# Patient Record
Sex: Male | Born: 1970
Health system: Southern US, Community
[De-identification: ages and names within clinical notes are randomized; demographics above are authoritative.]

## PROBLEM LIST (undated history)

## (undated) DIAGNOSIS — I420 Dilated cardiomyopathy: Secondary | ICD-10-CM

## (undated) HISTORY — PX: APPENDECTOMY: SHX54

## (undated) HISTORY — PX: OTHER SURGICAL HISTORY: SHX169

## (undated) HISTORY — DX: Dilated cardiomyopathy: I42.0

## (undated) HISTORY — PX: VASECTOMY: SHX75

---

## 2015-07-23 ENCOUNTER — Encounter: Payer: Self-pay | Admitting: Family

## 2015-07-23 ENCOUNTER — Ambulatory Visit (INDEPENDENT_AMBULATORY_CARE_PROVIDER_SITE_OTHER): Payer: BC Managed Care – PPO | Admitting: Family

## 2015-07-23 VITALS — BP 120/84 | HR 70 | Temp 98.5°F | Resp 18 | Ht 72.0 in | Wt 223.0 lb

## 2015-07-23 DIAGNOSIS — Z23 Encounter for immunization: Secondary | ICD-10-CM

## 2015-07-23 DIAGNOSIS — Z0000 Encounter for general adult medical examination without abnormal findings: Secondary | ICD-10-CM | POA: Diagnosis not present

## 2015-07-23 NOTE — Patient Instructions (Addendum)
Thank you for choosing [facility].  Summary/Instructions:  Please return on Wednesday or Thursday to have the TB skin test evaluated.   Health Maintenance, Male A healthy lifestyle and preventative care can promote health and wellness.  Maintain regular health, dental, and eye exams.  Eat a healthy diet. Foods like vegetables, fruits, whole grains, low-fat dairy products, and lean protein foods contain the nutrients you need and are low in calories. Decrease your intake of foods high in solid fats, added sugars, and salt. Get information about a proper diet from your health care provider, if necessary.  Regular physical exercise is one of the most important things you can do for your health. Most adults should get at least 150 minutes of moderate-intensity exercise (any activity that increases your heart rate and causes you to sweat) each week. In addition, most adults need muscle-strengthening exercises on 2 or more days a week.   Maintain a healthy weight. The body mass index (BMI) is a screening tool to identify possible weight problems. It provides an estimate of body fat based on height and weight. Your health care provider can find your BMI and can help you achieve or maintain a healthy weight. For males 20 years and older:  A BMI below 18.5 is considered underweight.  A BMI of 18.5 to 24.9 is normal.  A BMI of 25 to 29.9 is considered overweight.  A BMI of 30 and above is considered obese.  Maintain normal blood lipids and cholesterol by exercising and minimizing your intake of saturated fat. Eat a balanced diet with plenty of fruits and vegetables. Blood tests for lipids and cholesterol should begin at age 45 and be repeated every 5 years. If your lipid or cholesterol levels are high, you are over age 45, or you are at high risk for heart disease, you may need your cholesterol levels checked more frequently.Ongoing high lipid and cholesterol levels should be treated with  medicines if diet and exercise are not working.  If you smoke, find out from your health care provider how to quit. If you do not use tobacco, do not start.  Lung cancer screening is recommended for adults aged 55-80 years who are at high risk for developing lung cancer because of a history of smoking. A yearly low-dose CT scan of the lungs is recommended for people who have at least a 30-pack-year history of smoking and are current smokers or have quit within the past 15 years. A pack year of smoking is smoking an average of 1 pack of cigarettes a day for 1 year (for example, a 30-pack-year history of smoking could mean smoking 1 pack a day for 30 years or 2 packs a day for 15 years). Yearly screening should continue until the smoker has stopped smoking for at least 15 years. Yearly screening should be stopped for people who develop a health problem that would prevent them from having lung cancer treatment.  If you choose to drink alcohol, do not have more than 2 drinks per day. One drink is considered to be 12 oz (360 mL) of beer, 5 oz (150 mL) of wine, or 1.5 oz (45 mL) of liquor.  Avoid the use of street drugs. Do not share needles with anyone. Ask for help if you need support or instructions about stopping the use of drugs.  High blood pressure causes heart disease and increases the risk of stroke. High blood pressure is more likely to develop in:  People who have blood pressure  in the end of the normal range (100-139/85-89 mm Hg).  People who are overweight or obese.  People who are African American.  If you are 1-37 years of age, have your blood pressure checked every 3-5 years. If you are 2 years of age or older, have your blood pressure checked every year. You should have your blood pressure measured twice--once when you are at a hospital or clinic, and once when you are not at a hospital or clinic. Record the average of the two measurements. To check your blood pressure when you are not  at a hospital or clinic, you can use:  An automated blood pressure machine at a pharmacy.  A home blood pressure monitor.  If you are 79-55 years old, ask your health care provider if you should take aspirin to prevent heart disease.  Diabetes screening involves taking a blood sample to check your fasting blood sugar level. This should be done once every 3 years after age 21 if you are at a normal weight and without risk factors for diabetes. Testing should be considered at a younger age or be carried out more frequently if you are overweight and have at least 1 risk factor for diabetes.  Colorectal cancer can be detected and often prevented. Most routine colorectal cancer screening begins at the age of 49 and continues through age 47. However, your health care provider may recommend screening at an earlier age if you have risk factors for colon cancer. On a yearly basis, your health care provider may provide home test kits to check for hidden blood in the stool. A small camera at the end of a tube may be used to directly examine the colon (sigmoidoscopy or colonoscopy) to detect the earliest forms of colorectal cancer. Talk to your health care provider about this at age 36 when routine screening begins. A direct exam of the colon should be repeated every 5-10 years through age 80, unless early forms of precancerous polyps or small growths are found.  People who are at an increased risk for hepatitis B should be screened for this virus. You are considered at high risk for hepatitis B if:  You were born in a country where hepatitis B occurs often. Talk with your health care provider about which countries are considered high risk.  Your parents were born in a high-risk country and you have not received a shot to protect against hepatitis B (hepatitis B vaccine).  You have HIV or AIDS.  You use needles to inject street drugs.  You live with, or have sex with, someone who has hepatitis B.  You  are a man who has sex with other men (MSM).  You get hemodialysis treatment.  You take certain medicines for conditions like cancer, organ transplantation, and autoimmune conditions.  Hepatitis C blood testing is recommended for all people born from [birth year] through 1966 and any individual with known risk factors for hepatitis C.  Healthy men should no longer receive prostate-specific antigen (PSA) blood tests as part of routine cancer screening. Talk to your health care provider about prostate cancer screening.  Testicular cancer screening is not recommended for adolescents or adult males who have no symptoms. Screening includes self-exam, a health care provider exam, and other screening tests. Consult with your health care provider about any symptoms you have or any concerns you have about testicular cancer.  Practice safe sex. Use condoms and avoid high-risk sexual practices to reduce the spread of sexually transmitted infections (STIs).  You should be screened for STIs, including gonorrhea and chlamydia if:  You are sexually active and are younger than 24 years.  You are older than 24 years, and your health care provider tells you that you are at risk for this type of infection.  Your sexual activity has changed since you were last screened, and you are at an increased risk for chlamydia or gonorrhea. Ask your health care provider if you are at risk.  If you are at risk of being infected with HIV, it is recommended that you take a prescription medicine daily to prevent HIV infection. This is called pre-exposure prophylaxis (PrEP). You are considered at risk if:  You are a man who has sex with other men (MSM).  You are a heterosexual man who is sexually active with multiple partners.  You take drugs by injection.  You are sexually active with a partner who has HIV.  Talk with your health care provider about whether you are at high risk of being infected with HIV. If you choose to begin  PrEP, you should first be tested for HIV. You should then be tested every 3 months for as long as you are taking PrEP.  Use sunscreen. Apply sunscreen liberally and repeatedly throughout the day. You should seek shade when your shadow is shorter than you. Protect yourself by wearing long sleeves, pants, a wide-brimmed hat, and sunglasses year round whenever you are outdoors.  Tell your health care provider of new moles or changes in moles, especially if there is a change in shape or color. Also, tell your health care provider if a mole is larger than the size of a pencil eraser.  A one-time screening for abdominal aortic aneurysm (AAA) and surgical repair of large AAAs by ultrasound is recommended for men aged 65-75 years who are current or former smokers.  Stay current with your vaccines (immunizations).   This information is not intended to replace advice given to you by your health care provider. Make sure you discuss any questions you have with your health care provider.   Document Released: 02/28/2008 Document Revised: 09/22/2014 Document Reviewed: 01/27/2011 Elsevier Interactive Patient Education [employer]

## 2015-07-23 NOTE — Assessment & Plan Note (Addendum)
1) Anticipatory Guidance: Discussed importance of wearing a seatbelt while driving and not texting while driving; changing batteries in smoke detector at least once annually; wearing suntan lotion when outside; eating a balanced and moderate diet; getting physical activity at least 30 minutes per day.  2) Immunizations / Screenings / Labs: All immunizations are up to date per recommendations. Due for a TB skin test for pre-employment. All other screenings are up to date per recommendations. Obtain CBC, BMET, Lipid profile and TSH.   Overall well exam with minimal risk factors for cardiovascular disease with the exception of the cardiomyopathy. Continue healthy lifestyle behaviors. Declines blood work as previously performed through another provider with results requested. [facility] [employer] [facility] a [employer]. TB skin test placed. Cleared pending TB results. Follow up prevention exam in 1 year, follow up office visit as needed.

## 2015-07-23 NOTE — Progress Notes (Signed)
Pre visit review using our clinic review tool, if applicable. No additional management support is needed unless otherwise documented below in the visit note.  Refused flu shot 

## 2015-07-23 NOTE — Progress Notes (Signed)
Subjective:    Patient ID: Mark Graves, male    DOB: 08/15/1971, 44 y.o.   MRN: [id number]  Chief Complaint  Patient presents with  . Establish Care    having joint pains that come and goes and does know if thats from not working out where as he used to work out all time    HPI:  Mark Graves is a 44 y.o. male who presents today for an annual wellness visit.   1) Health Maintenance -   Diet - Averages about 3 meals per day consisting of whole grains, pork, chicken, beef, and vegetables. Caffeine intake of about 2 cups per day  Exercise - 4-5x per week of resistance and cardio   2) Preventative Exams / Immunizations:  Dental -- Due for exam   Vision -- Up to date   Health Maintenance  Topic Date Due  . HIV Screening  01/22/1985  . INFLUENZA VACCINE  05/30/2016 (Originally 04/16/2015)  . TETANUS/TDAP  07/20/2024     There is no immunization history on file for this patient.  No Known Allergies   No outpatient prescriptions prior to visit.   No facility-administered medications prior to visit.     Past Medical History  Diagnosis Date  . Dilated cardiomyopathy [facility])      Past Surgical History  Procedure Laterality Date  . Appendectomy    . Vasectomy    . Dilated cardiomyapathy       Family History  Problem Relation Age of Onset  . Diabetes Mother   . Heart disease Maternal Grandmother   . Cancer Maternal Grandmother   . Heart disease Maternal Grandfather   . Heart disease Paternal Grandmother   . Heart disease Paternal Grandfather      Social History   Social History  . Marital Status: Married    Spouse Name: N/A  . Number of Children: 4  . Years of Education: 14   Occupational History  . Not on file.   Social History Main Topics  . Smoking status: Never Smoker   . Smokeless tobacco: Never Used  . Alcohol Use: No  . Drug Use: No  . Sexual Activity: Not on file   Other Topics Concern  . Not on file   Social History  Narrative   Fun: Workout, acting, sports   Denies religious beliefs effecting health care.     Review of Systems  Constitutional: Denies fever, chills, fatigue, or significant weight gain/loss. HENT: Head: Denies headache or neck pain Ears: Denies changes in hearing, ringing in ears, earache, drainage Nose: Denies discharge, stuffiness, itching, nosebleed, sinus pain Throat: Denies sore throat, hoarseness, dry mouth, sores, thrush Eyes: Denies loss/changes in vision, pain, redness, blurry/double vision, flashing lights Cardiovascular: Denies chest pain/discomfort, tightness, palpitations, shortness of breath with activity, difficulty lying down, swelling, sudden awakening with shortness of breath Respiratory: Denies shortness of breath, cough, sputum production, wheezing Gastrointestinal: Denies dysphasia, heartburn, change in appetite, nausea, change in bowel habits, rectal bleeding, constipation, diarrhea, yellow skin or eyes Genitourinary: Denies frequency, urgency, burning/pain, blood in urine, incontinence, change in urinary strength. Musculoskeletal: Denies muscle/joint pain, stiffness, back pain, redness or swelling of joints, trauma Skin: Denies rashes, lumps, itching, dryness, color changes, or hair/nail changes Neurological: Denies dizziness, fainting, seizures, weakness, numbness, tingling, tremor Psychiatric - Denies nervousness, stress, depression or memory loss Endocrine: Denies heat or cold intolerance, sweating, frequent urination, excessive thirst, changes in appetite Hematologic: Denies ease of bruising or bleeding     Objective:  BP 120/84 mmHg  Pulse 70  Temp(Src) 98.5 F (36.9 C) (Oral)  Resp 18  Ht 6' (1.829 m)  Wt 223 lb (101.152 kg)  BMI 30.24 kg/m2  SpO2 97% Nursing note and vital signs reviewed.  Physical Exam  Constitutional: He is oriented to person, place, and time. He appears well-developed and well-nourished.  HENT:  Head: Normocephalic.    Right Ear: Hearing, tympanic membrane, external ear and ear canal normal.  Left Ear: Hearing, tympanic membrane, external ear and ear canal normal.  Nose: Nose normal.  Mouth/Throat: Uvula is midline, oropharynx is clear and moist and mucous membranes are normal.  Eyes: Conjunctivae and EOM are normal. Pupils are equal, round, and reactive to light.  Neck: Neck supple. No JVD present. No tracheal deviation present. No thyromegaly present.  Cardiovascular: Normal rate, regular rhythm, normal heart sounds and intact distal pulses.   Pulmonary/Chest: Effort normal and breath sounds normal.  Abdominal: Soft. Bowel sounds are normal. He exhibits no distension and no mass. There is no tenderness. There is no rebound and no guarding.  Musculoskeletal: Normal range of motion. He exhibits no edema or tenderness.  Lymphadenopathy:    He has no cervical adenopathy.  Neurological: He is alert and oriented to person, place, and time. He has normal reflexes. No cranial nerve deficit. He exhibits normal muscle tone. Coordination normal.  Skin: Skin is warm and dry.  Psychiatric: He has a normal mood and affect. His behavior is normal. Judgment and thought content normal.       Assessment & Plan:   Problem List Items Addressed This Visit      Other   Routine general medical examination at a health care facility - Primary    1) Anticipatory Guidance: Discussed importance of wearing a seatbelt while driving and not texting while driving; changing batteries in smoke detector at least once annually; wearing suntan lotion when outside; eating a balanced and moderate diet; getting physical activity at least 30 minutes per day.  2) Immunizations / Screenings / Labs: All immunizations are up to date per recommendations. Due for a TB skin test for pre-employment. All other screenings are up to date per recommendations. Obtain CBC, BMET, Lipid profile and TSH.   Overall well exam with minimal risk factors for  cardiovascular disease with the exception of the cardiomyopathy. Continue healthy lifestyle behaviors. Declines blood work as previously performed through another provider with results requested. [facility] [employer] [facility] a [employer]. TB skin test placed. Cleared pending TB results. Follow up prevention exam in 1 year, follow up office visit as needed.

## 2015-07-26 DIAGNOSIS — Z23 Encounter for immunization: Secondary | ICD-10-CM | POA: Diagnosis not present

## 2015-07-26 LAB — TB SKIN TEST
Induration: 0 mm
TB Skin Test: NEGATIVE

## 2015-07-26 NOTE — Addendum Note (Signed)
Addended by: Mercer PodWRENN, Jessenya Berdan E on: 07/26/2015 09:34 AM   Modules accepted: Orders

## 2017-05-12 ENCOUNTER — Ambulatory Visit (INDEPENDENT_AMBULATORY_CARE_PROVIDER_SITE_OTHER): Payer: BC Managed Care – PPO | Admitting: Physician Assistant

## 2017-05-12 ENCOUNTER — Ambulatory Visit (INDEPENDENT_AMBULATORY_CARE_PROVIDER_SITE_OTHER): Payer: BC Managed Care – PPO

## 2017-05-12 ENCOUNTER — Encounter: Payer: Self-pay | Admitting: Physician Assistant

## 2017-05-12 VITALS — BP 127/83 | HR 78 | Temp 98.3°F | Resp 16 | Ht 72.0 in | Wt 219.2 lb

## 2017-05-12 DIAGNOSIS — M545 Low back pain, unspecified: Secondary | ICD-10-CM

## 2017-05-12 LAB — POCT URINALYSIS DIP (MANUAL ENTRY)
Bilirubin, UA: NEGATIVE
Blood, UA: NEGATIVE
Glucose, UA: NEGATIVE mg/dL
Ketones, POC UA: NEGATIVE mg/dL
Leukocytes, UA: NEGATIVE
Nitrite, UA: NEGATIVE
Protein Ur, POC: NEGATIVE mg/dL
Spec Grav, UA: 1.025 (ref 1.010–1.025)
Urobilinogen, UA: 0.2 E.U./dL
pH, UA: 6.5 (ref 5.0–8.0)

## 2017-05-12 LAB — POC MICROSCOPIC URINALYSIS (UMFC): Mucus: ABSENT

## 2017-05-12 MED ORDER — CYCLOBENZAPRINE HCL 10 MG PO TABS: 10.0000 mg | ORAL_TABLET | Freq: Three times a day (TID) | ORAL | 0 refills | Status: DC | PRN

## 2017-05-12 MED ORDER — MELOXICAM 15 MG PO TABS: 15.0000 mg | ORAL_TABLET | Freq: Every day | ORAL | 1 refills | Status: DC

## 2017-05-12 NOTE — Patient Instructions (Addendum)
Your x-ray shows is no evidence of lumbar spine fracture. Alignment is normal. Intervertebral disc spaces are maintained.  Apply heat and/or ice to your low back 2-3 times daily. Put ice in a plastic bag. Place a towel between your skin and the bag or between your plaster splint and the bag. Leave the ice on for 20 minutes, 2-3 times a day. Ibuprofen and/or Tylenol for pain.  Stretching and moving will help greatly. See below stretches and do this 2-3 times daily.  Stay well hydrated - drink 2-3 liters of water daily.  Core strength helps your posture and maintaining good lumbar support. Start doing core-strengthening exercises.  Acupuncture may be a reasonable option for interested patients with access to an acupuncturist. Massage and chiropractic measures may help as well.   Flexeril is a muscle relaxer. Take this as prescribed. This will be helpful to you at night to relax your muscles and help you sleep. Meloxicam is an antiinflammatory. Take this once daily. Do not use with any other otc pain medication other than tylenol/acetaminophen - so no aleve, ibuprofen, motrin, advil, etc. You may take these medications at the same time.  Come back if you are not improving in 4-6 weeks.   Thank you for coming in today. I hope you feel we met your needs.  Feel free to call PCP if you have any questions or further requests.  Please consider signing up for MyChart if you do not already have it, as this is a great way to communicate with me.  Best,  Whitney McVey, PA-C   Low Back Strain Rehab Ask your health care provider which exercises are safe for you. Do exercises exactly as told by your health care provider and adjust them as directed. It is normal to feel mild stretching, pulling, tightness, or discomfort as you do these exercises, but you should stop right away if you feel sudden pain or your pain gets worse. Do not begin these exercises until told by your health care provider. Stretching and  range of motion exercises These exercises warm up your muscles and joints and improve the movement and flexibility of your back. These exercises also help to relieve pain, numbness, and tingling. Exercise A: Single knee to chest  1. Lie on your back on a firm surface with both legs straight. 2. Bend one of your knees. Use your hands to move your knee up toward your chest until you feel a gentle stretch in your lower back and buttock. ? Hold your leg in this position by holding onto the front of your knee. ? Keep your other leg as straight as possible. 3. Hold for __________ seconds. 4. Slowly return to the starting position. 5. Repeat with your other leg. Repeat __________ times. Complete this exercise __________ times a day. Exercise B: Prone extension on elbows  1. Lie on your abdomen on a firm surface. 2. Prop yourself up on your elbows. 3. Use your arms to help lift your chest up until you feel a gentle stretch in your abdomen and your lower back. ? This will place some of your body weight on your elbows. If this is uncomfortable, try stacking pillows under your chest. ? Your hips should stay down, against the surface that you are lying on. Keep your hip and back muscles relaxed. 4. Hold for __________ seconds. 5. Slowly relax your upper body and return to the starting position. Repeat __________ times. Complete this exercise __________ times a day. Strengthening exercises These exercises  build strength and endurance in your back. Endurance is the ability to use your muscles for a long time, even after they get tired. Exercise C: Pelvic tilt 1. Lie on your back on a firm surface. Bend your knees and keep your feet flat. 2. Tense your abdominal muscles. Tip your pelvis up toward the ceiling and flatten your lower back into the floor. ? To help with this exercise, you may place a small towel under your lower back and try to push your back into the towel. 3. Hold for __________  seconds. 4. Let your muscles relax completely before you repeat this exercise. Repeat __________ times. Complete this exercise __________ times a day. Exercise D: Alternating arm and leg raises  1. Get on your hands and knees on a firm surface. If you are on a hard floor, you may want to use padding to cushion your knees, such as an exercise mat. 2. Line up your arms and legs. Your hands should be below your shoulders, and your knees should be below your hips. 3. Lift your left leg behind you. At the same time, raise your right arm and straighten it in front of you. ? Do not lift your leg higher than your hip. ? Do not lift your arm higher than your shoulder. ? Keep your abdominal and back muscles tight. ? Keep your hips facing the ground. ? Do not arch your back. ? Keep your balance carefully, and do not hold your breath. 4. Hold for __________ seconds. 5. Slowly return to the starting position and repeat with your right leg and your left arm. Repeat __________ times. Complete this exercise __________times a day. Exercise J: Single leg lower with bent knees 1. Lie on your back on a firm surface. 2. Tense your abdominal muscles and lift your feet off the floor, one foot at a time, so your knees and hips are bent in an "L" shape (at about 90 degrees). ? Your knees should be over your hips and your lower legs should be parallel to the floor. 3. Keeping your abdominal muscles tense and your knee bent, slowly lower one of your legs so your toe touches the ground. 4. Lift your leg back up to return to the starting position. ? Do not hold your breath. ? Do not let your back arch. Keep your back flat against the ground. 5. Repeat with your other leg. Repeat __________ times. Complete this exercise __________ times a day. Posture and body mechanics  Body mechanics refers to the movements and positions of your body while you do your daily activities. Posture is part of body mechanics. Good  posture and healthy body mechanics can help to relieve stress in your body's tissues and joints. Good posture means that your spine is in its natural S-curve position (your spine is neutral), your shoulders are pulled back slightly, and your head is not tipped forward. The following are general guidelines for applying improved posture and body mechanics to your everyday activities. Standing   When standing, keep your spine neutral and your feet about hip-width apart. Keep a slight bend in your knees. Your ears, shoulders, and hips should line up.  When you do a task in which you stand in one place for a long time, place one foot up on a stable object that is 2-4 inches (5-10 cm) high, such as a footstool. This helps keep your spine neutral. Sitting   When sitting, keep your spine neutral and keep your feet flat on  the floor. Use a footrest, if necessary, and keep your thighs parallel to the floor. Avoid rounding your shoulders, and avoid tilting your head forward.  When working at a desk or a computer, keep your desk at a height where your hands are slightly lower than your elbows. Slide your chair under your desk so you are close enough to maintain good posture.  When working at a computer, place your monitor at a height where you are looking straight ahead and you do not have to tilt your head forward or downward to look at the screen. Resting   When lying down and resting, avoid positions that are most painful for you.  If you have pain with activities such as sitting, bending, stooping, or squatting (flexion-based activities), lie in a position in which your body does not bend very much. For example, avoid curling up on your side with your arms and knees near your chest (fetal position).  If you have pain with activities such as standing for a long time or reaching with your arms (extension-based activities), lie with your spine in a neutral position and bend your knees slightly. Try the  following positions: ? Lying on your side with a pillow between your knees. ? Lying on your back with a pillow under your knees. Lifting   When lifting objects, keep your feet at least shoulder-width apart and tighten your abdominal muscles.  Bend your knees and hips and keep your spine neutral. It is important to lift using the strength of your legs, not your back. Do not lock your knees straight out.  Always ask for help to lift heavy or awkward objects. This information is not intended to replace advice given to you by your health care provider. Make sure you discuss any questions you have with your health care provider. Document Released: 09/01/2005 Document Revised: 05/08/2016 Document Reviewed: 06/13/2015 Elsevier Interactive Patient Education  2018 [employer].   IF you received an x-ray today, you will receive an invoice from [facility]. Please contact [facility] at [phone] with questions or concerns regarding your invoice.   IF you received labwork today, you will receive an invoice from [facility]. Please contact [facility] at [phone] with questions or concerns regarding your invoice.   Our billing staff will not be able to assist you with questions regarding bills from these companies.  You will be contacted with the lab results as soon as they are available. The fastest way to get your results is to activate your My Chart account. Instructions are located on the last page of this paperwork. If you have not heard from [place] regarding the results in 2 weeks, please contact this office.

## 2017-05-12 NOTE — Progress Notes (Signed)
   Mark Graves  MRN: [id number] DOB: 1971/03/26  PCP: Veryl Speak, FNP  Subjective:  Pt is a 46 year old male who presents to clinic for lower back pain x 1 week. He recently moved to the area and has been cleaning up the basement of his new home. No MOI. Walking does not aggravate his symptoms. Sitting for too long hurts worse. Bending forward to pick something up makes it hurt worse. He is sleeping on the floor to feel better. Pain does not radiate.  Denies saddle paresthesia, loss of bowel or bladder function, n/t, groin pain, flank pain.    Review of Systems  Constitutional: Negative for chills, fatigue and fever.  Gastrointestinal: Negative for constipation and diarrhea.  Genitourinary: Negative for discharge, dysuria, hematuria, penile pain, penile swelling, scrotal swelling, testicular pain and urgency.  Musculoskeletal: Positive for back pain (low back).  Skin: Negative.   Neurological: Negative for weakness and numbness.    Patient Active Problem List   Diagnosis Date Noted  . Routine general medical examination at a health care facility 07/23/2015    No current outpatient prescriptions on file prior to visit.   No current facility-administered medications on file prior to visit.     No Known Allergies   Objective:  BP 127/83   Pulse 78   Temp 98.3 F (36.8 C) (Oral)   Resp 16   Ht 6' (1.829 m)   Wt 219 lb 3.2 oz (99.4 kg)   SpO2 97%   BMI 29.73 kg/m   Physical Exam  Constitutional: He is oriented to person, place, and time and well-developed, well-nourished, and in no distress. No distress.  Cardiovascular: Normal rate, regular rhythm and normal heart sounds.   Musculoskeletal:       Lumbar back: He exhibits tenderness (right low back). He exhibits normal range of motion, no bony tenderness, no deformity and no spasm.  Neurological: He is alert and oriented to person, place, and time. He has normal motor skills, normal sensation and normal strength.  He has a normal Straight Leg Raise Test. GCS score is 15.  Skin: Skin is warm and dry.  Psychiatric: Mood, memory, affect and judgment normal.  Vitals reviewed.  Dg Lumbar Spine Complete  Result Date: 05/12/2017 CLINICAL DATA:  Lifting injury 1 week ago with right-sided back pain. No radiculopathy. EXAM: LUMBAR SPINE - COMPLETE 4+ VIEW COMPARISON:  None. FINDINGS: There is no evidence of lumbar spine fracture. Alignment is normal. Intervertebral disc spaces are maintained. IMPRESSION: Negative. Electronically Signed   By: Marnee Spring M.D.   On: 05/12/2017 14:39   Assessment and Plan :  1. Right-sided low back pain without sciatica, unspecified chronicity - POCT urinalysis dipstick - POCT Microscopic Urinalysis ([facility]) - DG Lumbar Spine Complete; Future - cyclobenzaprine (FLEXERIL) 10 MG tablet; Take 1 tablet (10 mg total) by mouth 3 (three) times daily as needed for muscle spasms.  Dispense: 30 tablet; Refill: 0 - meloxicam (MOBIC) 15 MG tablet; Take 1 tablet (15 mg total) by mouth daily.  Dispense: 30 tablet; Refill: 1 - X-ray is negative. Advised ice/heat, stretching, hydration, core strengthening and proper body mechanics. RTC in 4-6 weeks if no improvement. Consider physical therapy referral.   Marco Collie, PA-C  Primary Care at [facility] 05/12/2017 2:05 PM

## 2018-10-25 ENCOUNTER — Ambulatory Visit
Admission: EM | Admit: 2018-10-25 | Discharge: 2018-10-25 | Disposition: A | Discharge status: Home / Self Care | Payer: 59 | Attending: Family Medicine | Admitting: Family Medicine

## 2018-10-25 DIAGNOSIS — H44001 Unspecified purulent endophthalmitis, right eye: Secondary | ICD-10-CM

## 2018-10-25 MED ORDER — ERYTHROMYCIN 5 MG/GM OP OINT: TOPICAL_OINTMENT | OPHTHALMIC | 0 refills | Status: DC

## 2018-10-25 NOTE — ED Provider Notes (Signed)
[facility]    CSN: [id number] Arrival date & time: 10/25/18  1315     History   Chief Complaint Chief Complaint  Patient presents with  . Eye Drainage    HPI Mark Graves is a 48 y.o. male.   HPI  Patient states he has some redness of his right eye.  Is been present 2 days.  Slightly gritty feeling.  No visual defect.  Slight crusting.  Some swelling of the lids.  He does not think he got anything in his eye while he was doing some woodwork around the house.  No cough or cold symptoms.  No runny nose.  No left eye symptoms.  He does have a history of having a "virus" in his eye that recurred 1 time.  He wants to make sure he does not have a viral infection recurring.  Past Medical History:  Diagnosis Date  . Dilated cardiomyopathy (HCC)     There are no active problems to display for this patient.   Past Surgical History:  Procedure Laterality Date  . APPENDECTOMY    . dilated cardiomyapathy    . VASECTOMY         Home Medications    Prior to Admission medications   Medication Sig Start Date End Date Taking? Authorizing Provider  erythromycin ophthalmic ointment Place a 1/2 inch ribbon of ointment into the lower eyelid. 2-3 times a day 10/25/18   Eustace MooreNelson,  Sue, MD    Family History Family History  Problem Relation Age of Onset  . Diabetes Mother   . Heart disease Maternal Grandmother   . Cancer Maternal Grandmother   . Heart disease Maternal Grandfather   . Heart disease Paternal Grandmother   . Heart disease Paternal Grandfather     Social History Social History   Tobacco Use  . Smoking status: Never Smoker  . Smokeless tobacco: Never Used  Substance Use Topics  . Alcohol use: No    Alcohol/week: 0.0 standard drinks  . Drug use: No     Allergies   Patient has no known allergies.   Review of Systems Review of Systems  Constitutional: Negative for chills and fever.  HENT: Negative for ear pain and sore throat.   Eyes:  Positive for discharge, redness and itching. Negative for photophobia, pain and visual disturbance.  Respiratory: Negative for cough and shortness of breath.   Cardiovascular: Negative for chest pain and palpitations.  Gastrointestinal: Negative for abdominal pain and vomiting.  Genitourinary: Negative for dysuria and hematuria.  Musculoskeletal: Negative for arthralgias and back pain.  Skin: Negative for color change and rash.  Neurological: Negative for seizures and syncope.  All other systems reviewed and are negative.    Physical Exam Triage Vital Signs ED Triage Vitals  Enc Vitals Group     BP 10/25/18 1330 132/79     Pulse Rate 10/25/18 1330 80     Resp 10/25/18 1330 18     Temp 10/25/18 1330 98.6 F (37 C)     Temp Source 10/25/18 1330 Oral     SpO2 10/25/18 1330 98 %   No data found.  Updated Vital Signs BP 132/79 (BP Location: Left Arm)   Pulse 80   Temp 98.6 F (37 C) (Oral)   Resp 18   SpO2 98%      Physical Exam Constitutional:      General: He is not in acute distress.    Appearance: He is well-developed and normal weight.  HENT:  Head: Normocephalic and atraumatic.     Right Ear: Tympanic membrane, ear canal and external ear normal.     Left Ear: Tympanic membrane, ear canal and external ear normal.     Nose: Nose normal. No congestion.     Mouth/Throat:     Pharynx: No posterior oropharyngeal erythema.  Eyes:     General: Lids are normal. Lids are everted, no foreign bodies appreciated.        Right eye: Discharge present. No foreign body.        Left eye: No foreign body.     Conjunctiva/sclera:     Right eye: Right conjunctiva is injected.     Pupils: Pupils are equal, round, and reactive to light.     Right eye: No corneal abrasion or fluorescein uptake.  Neck:     Musculoskeletal: Normal range of motion.  Cardiovascular:     Rate and Rhythm: Normal rate.  Pulmonary:     Effort: Pulmonary effort is normal. No respiratory distress.    Abdominal:     General: There is no distension.     Palpations: Abdomen is soft.  Musculoskeletal: Normal range of motion.  Lymphadenopathy:     Cervical: No cervical adenopathy.  Skin:    General: Skin is warm and dry.  Neurological:     Mental Status: He is alert.      UC Treatments / Results  Labs (all labs ordered are listed, but only abnormal results are displayed) Labs Reviewed - No data to display  EKG None  Radiology No results found.  Procedures Procedures (including critical care time)  Medications Ordered in UC Medications - No data to display  Initial Impression / Assessment and Plan / UC Course  I have reviewed the triage vital signs and the nursing notes.  Pertinent labs & imaging results that were available during my care of the patient were reviewed by me and considered in my medical decision making (see chart for details).     I did not see any foreign body from the status.  I do not see any fluorescein uptake consistent with a virus.  I told him I believe he has a conjunctivitis.  Will treat with antibiotic ointment.  To see eye specialist if not improved in a couple days Final Clinical Impressions(s) / UC Diagnoses   Final diagnoses:  Eye infection, right     Discharge Instructions     Warm compresses Use eye ointment as directed See eye doctor if irritation persists   ED Prescriptions    Medication Sig Dispense Auth. Provider   erythromycin ophthalmic ointment Place a 1/2 inch ribbon of ointment into the lower eyelid. 2-3 times a day 1 g Eustace Moore, MD     Controlled Substance Prescriptions [place] Controlled Substance Registry consulted? Not Applicable   Eustace Moore, MD 10/25/18 1800

## 2018-10-25 NOTE — ED Triage Notes (Signed)
Pt c/o rt eye swelling, redness and irritation since yesterday. States was around saw dust the day before

## 2018-10-25 NOTE — Discharge Instructions (Signed)
Warm compresses Use eye ointment as directed See eye doctor if irritation persists

## 2018-11-18 IMAGING — DX DG LUMBAR SPINE COMPLETE 4+V
5 series · 5 of 5 positions shown · non-contrast
Comparison: None.

CLINICAL DATA: Lifting injury 1 week ago with right-sided back
pain. No radiculopathy.

EXAM:
LUMBAR SPINE - COMPLETE 4+ VIEW

[l-spine ap]
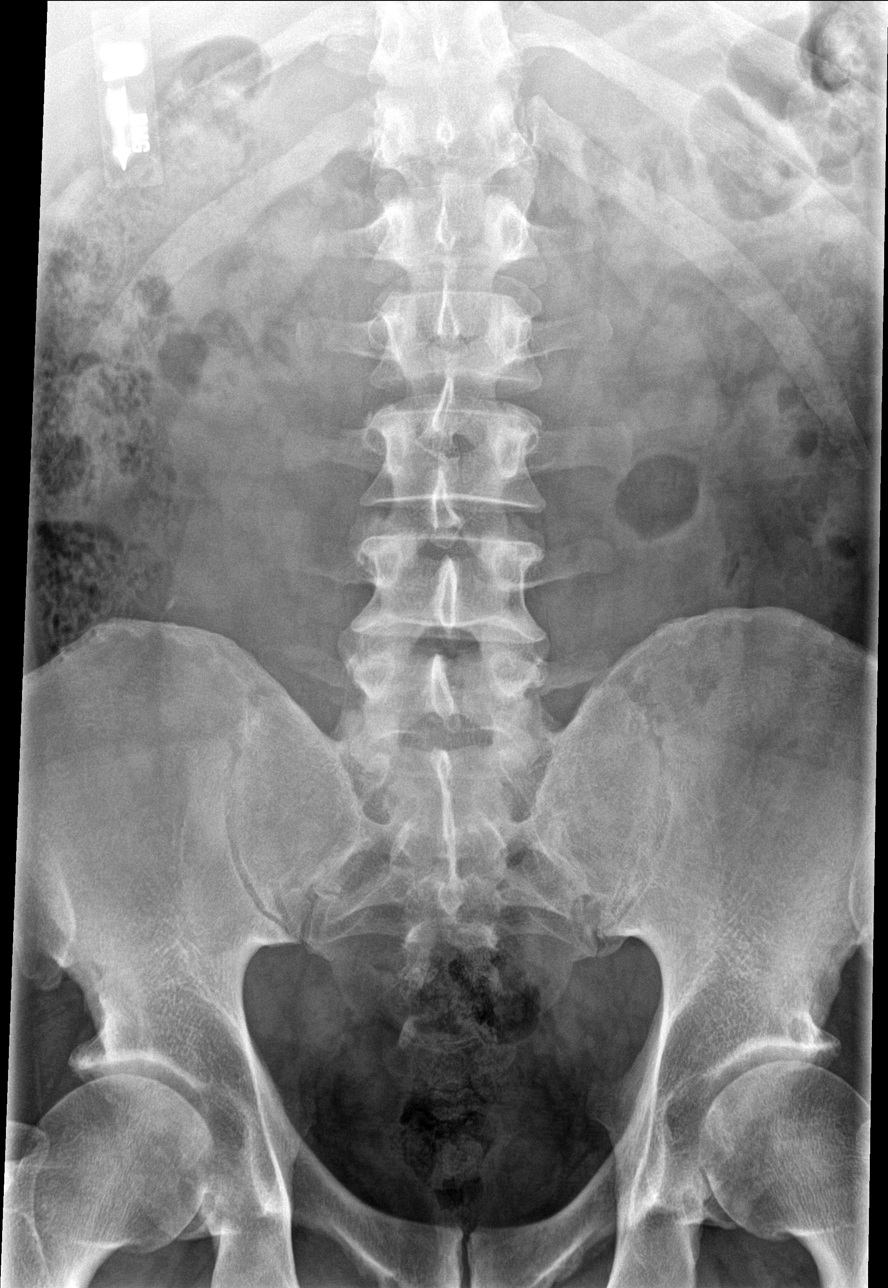

[l-spine obl (1 of 2)]
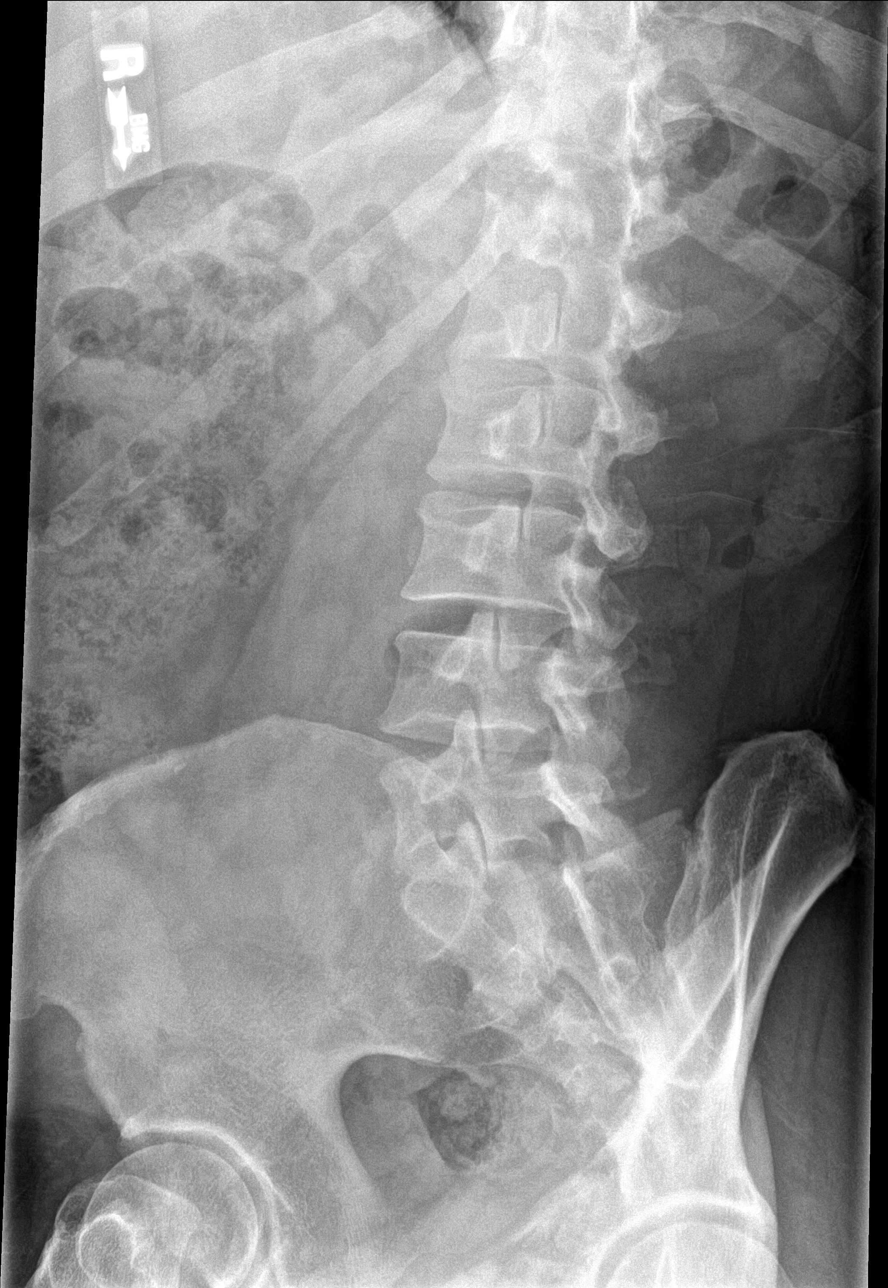

[l-spine obl (2 of 2)]
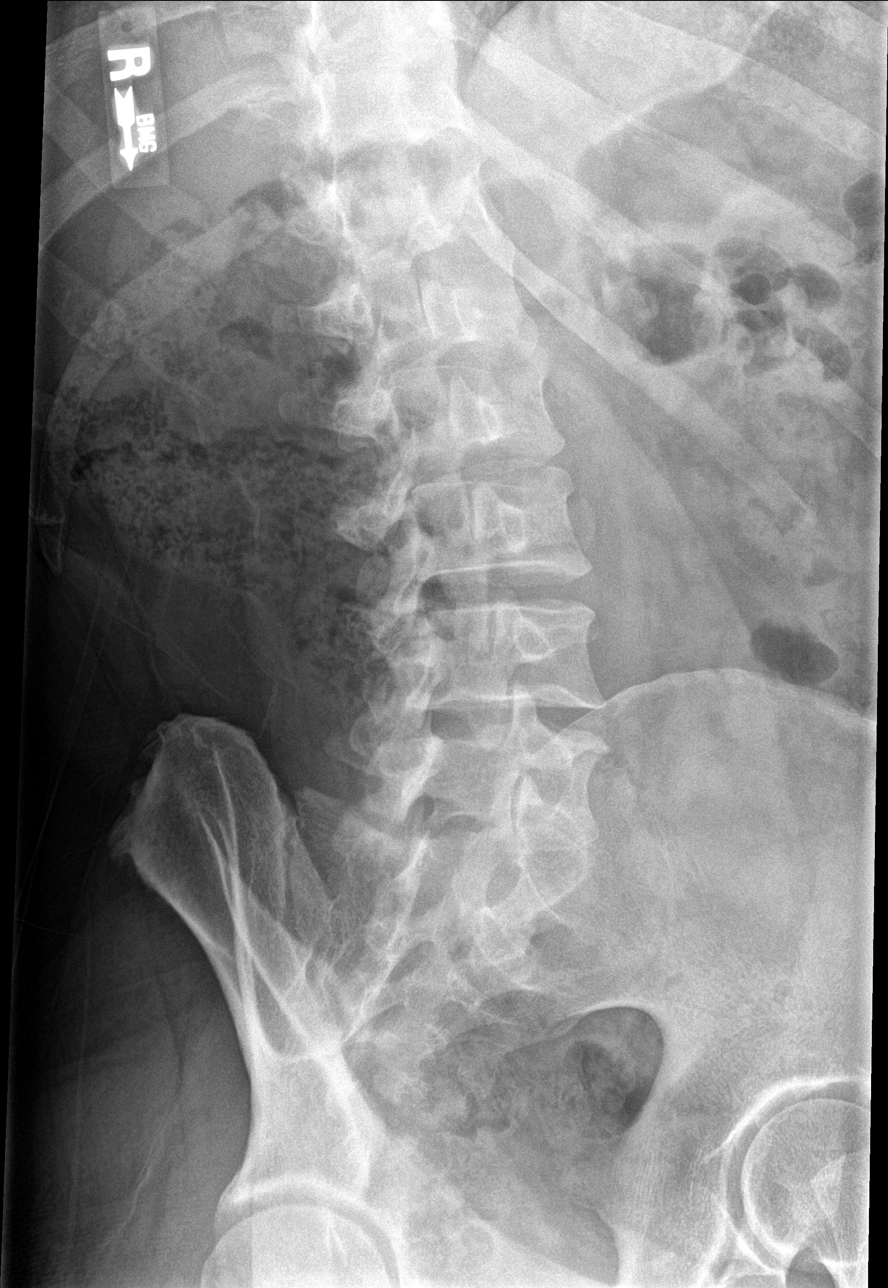

[l-spine lat]
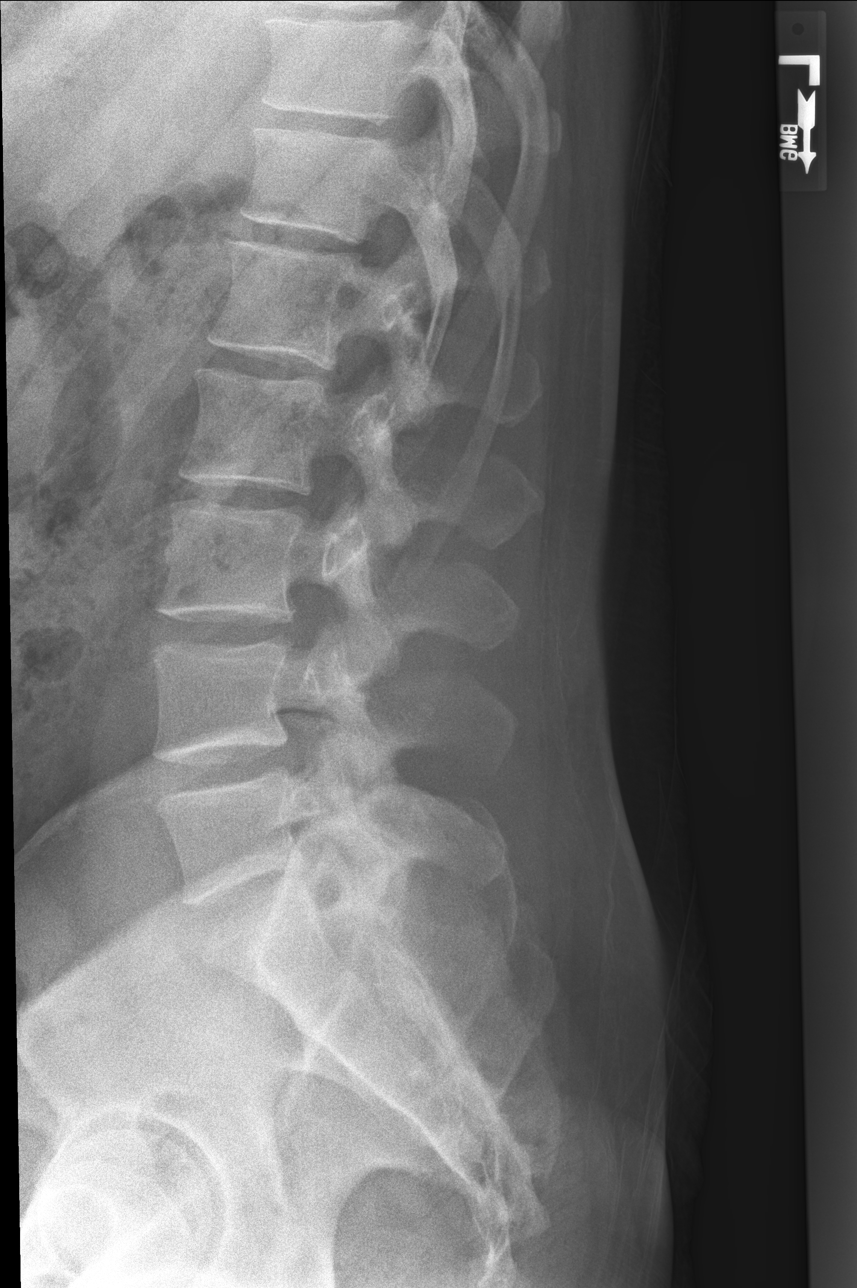

[l-spine l5-s1]
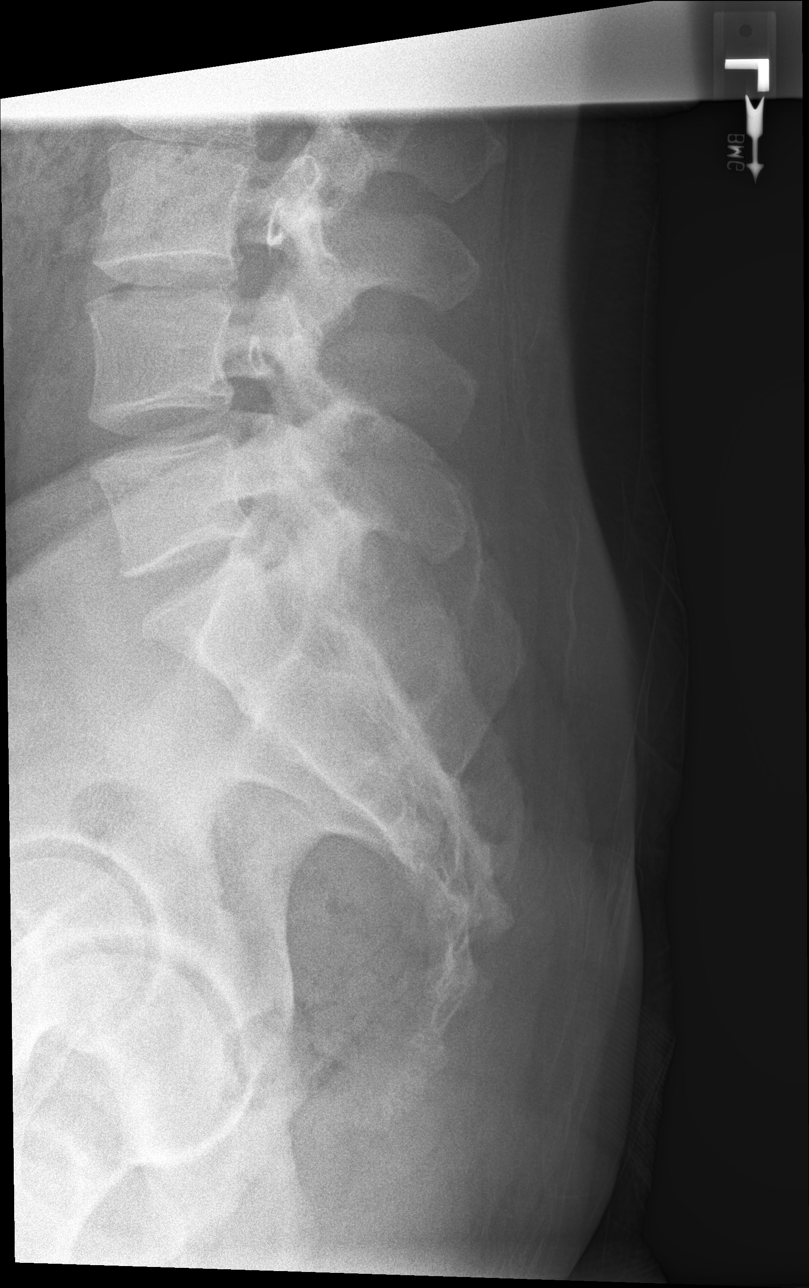

[5 of 5 positions shown; findings below may reference images not displayed]

FINDINGS: There is no evidence of lumbar spine fracture. Alignment is normal.
Intervertebral disc spaces are maintained.
IMPRESSION: Negative.

## 2020-11-12 DIAGNOSIS — R0789 Other chest pain: Secondary | ICD-10-CM | POA: Diagnosis not present

## 2020-11-12 DIAGNOSIS — Z8679 Personal history of other diseases of the circulatory system: Secondary | ICD-10-CM | POA: Diagnosis not present

## 2020-11-18 NOTE — Progress Notes (Unsigned)
Cardiology Office Note:    Date:  11/20/2020   ID:  Mark Graves, DOB 10-08-1970, MRN [id number]  PCP:  Patient, No Pcp Per   [facility]  Cardiologist:  No primary care provider on file.  Advanced Practice Provider:  No care team member to display Electrophysiologist:  None    Referring MD: Daisy Floro, MD    History of Present Illness:    Mark Graves is a 50 y.o. male with a hx of dilated cardiomyopathy who was referred by Dr. Tenny Craw for further evaluation of dilated cardiomyopathy.  Patient has history of dilated cardiomyopathy which was diagnosed in 2004 in [place]. Was seen by a Cardiologist at that time, but has not followed up here in [place].   Today, the patient states that he began weight lifting in [place] in 2004 and started developing GERD like symptoms, syncope, and shortness of breath. Had about a week of symptoms and then finally went to the ER for evaluation. Received a catheterization and there was no blockages at that time, but he was told his arteries were "swollen" and the heart was enlarged. Was prescribed medication, but he did not take them and he changed his diet. Saw Cardiologist about 1 month later and he was told his condition improved and no changes were made. He was doing well until 2008 where he had recurrence of GERD like symptoms but it went away and he did not take medications at that time. In 2017, he presented in [place] with chest discomfort. Was evaluated in the ER and discharged home. Developed recurrent symptoms in 09/2020 with the sensation that his heart was skipping beats. Saw PCP and was referred here for further management.   Today, the patient states he has been struggling of depression. He is active and works out with jogging and lifting weights daily. No chest pain, SOB, lightheadedness or dizziness. No LE edema, orthopnea, or PND. Has palpitations and like his heart is skipping beats. Occurring 15-20x/day and lasts  a couple a seconds before abating. No associated symptoms.   No family history of cardiomyopathy.   TC 179, TG 148, HDL 30, LDL 122.  Past Medical History:  Diagnosis Date  . Dilated cardiomyopathy [facility])     Past Surgical History:  Procedure Laterality Date  . APPENDECTOMY    . dilated cardiomyapathy    . VASECTOMY      Current Medications: Current Meds  Medication Sig  . Cholecalciferol (VITAMIN D3 MAXIMUM STRENGTH) 125 MCG (5000 UT) capsule See admin instructions.  . Multiple Vitamin (MULTIVITAMIN) tablet Take 1 tablet by mouth daily.     Allergies:   Patient has no known allergies.   Social History   Socioeconomic History  . Marital status: Married    Spouse name: Not on file  . Number of children: 4  . Years of education: 34  . Highest education level: Not on file  Occupational History  . Not on file  Tobacco Use  . Smoking status: Never Smoker  . Smokeless tobacco: Never Used  Vaping Use  . Vaping Use: Never used  Substance and Sexual Activity  . Alcohol use: No    Alcohol/week: 0.0 standard drinks  . Drug use: No  . Sexual activity: Not on file  Other Topics Concern  . Not on file  Social History Narrative   Fun: Workout, acting, sports   Denies religious beliefs effecting health care.   Social Determinants of Health   Financial Resource Strain: Not  on file  Food Insecurity: Not on file  Transportation Needs: Not on file  Physical Activity: Not on file  Stress: Not on file  Social Connections: Not on file     Family History: The patient's family history includes Cancer in his maternal grandmother; Diabetes in his mother; Heart disease in his maternal grandfather, maternal grandmother, paternal grandfather, and paternal grandmother.  ROS:   Please see the history of present illness.    Review of Systems  Constitutional: Negative for chills, fever and malaise/fatigue.  HENT: Negative for hearing loss.   Eyes: Negative for blurred vision and  redness.  Respiratory: Negative for shortness of breath.   Cardiovascular: Positive for palpitations. Negative for chest pain, orthopnea, claudication, leg swelling and PND.  Gastrointestinal: Negative for melena, nausea and vomiting.  Genitourinary: Negative for dysuria.  Musculoskeletal: Positive for myalgias.  Neurological: Negative for dizziness and loss of consciousness.  Endo/Heme/Allergies: Negative for polydipsia.  Psychiatric/Behavioral: Negative for substance abuse.    EKGs/Labs/Other Studies Reviewed:    The following studies were reviewed today: No studies in our system  EKG:  EKG is  ordered today.  The ekg ordered today demonstrates NSR with diffuse TWI in precordial leads (? Early repol); HR 81  Recent Labs: No results found for requested labs within last 8760 hours.  Recent Lipid Panel No results found for: CHOL, TRIG, HDL, CHOLHDL, VLDL, LDLCALC, LDLDIRECT    Physical Exam:    VS:  BP 120/90   Pulse 81   Ht 6' (1.829 m)   Wt 226 lb (102.5 kg)   SpO2 98%   BMI 30.65 kg/m     Wt Readings from Last 3 Encounters:  11/20/20 226 lb (102.5 kg)  05/12/17 219 lb 3.2 oz (99.4 kg)  07/23/15 223 lb (101.2 kg)     GEN:  Well nourished, well developed in no acute distress HEENT: Normal NECK: No JVD; No carotid bruits CARDIAC: RRR, no murmurs, rubs, gallops RESPIRATORY:  Clear to auscultation without rales, wheezing or rhonchi  ABDOMEN: Soft, non-tender, non-distended MUSCULOSKELETAL:  No edema; No deformity  SKIN: Warm and dry NEUROLOGIC:  Alert and oriented x 3 PSYCHIATRIC:  Normal affect   ASSESSMENT:    1. Cardiomyopathy, unspecified type (HCC)   2. Palpitations    PLAN:    In order of problems listed above:  #Dilated Cardiomyopathy: Patient with history of dilated cardiomyopathy that was diagnosed in 2004 in [place]. Received a cath at that time (records not available) which showed no obstructive disease but "swollen arteries" and an "enlarged heart  with reduced pumping function." Was recommended fro medication but he did not take it. Followed up with Cardiology in [facility] and was told he had improved. Had several other episodes of chest discomfort over the course of the years but no further thorough cardiac work-up. Has not been on medication. Patient is active with no HF or anginal symptoms. Complains of palpitations which are new for him but no chest pain, orthopnea, LE edema, PND or SOB.  -Check TTE -Not on any medications currently  #Palpitations: Has been ongoing for the past couple of months. Occurring 15-20x/day for several seconds at a time. No associated symptoms.  -Check 7 day zio monitor -Check TTE as above   Medication Adjustments/Labs and Tests Ordered: Current medicines are reviewed at length with the patient today.  Concerns regarding medicines are outlined above.  Orders Placed This Encounter  Procedures  . LONG TERM MONITOR (3-14 DAYS)  . EKG 12-Lead  .  ECHOCARDIOGRAM COMPLETE   No orders of the defined types were placed in this encounter.   Patient Instructions  Medication Instructions:  Your physician recommends that you continue on your current medications as directed. Please refer to the Current Medication list given to you today. *If you need a refill on your cardiac medications before your next appointment, please call your pharmacy*   Lab Work: None  If you have labs (blood work) drawn today and your tests are completely normal, you will receive your results only by: Marland Kitchen. MyChart Message (if you have MyChart) OR . A paper copy in the mail If you have any lab test that is abnormal or we need to change your treatment, we will call you to review the results.   Testing/Procedures: Your physician has requested that you have an echocardiogram. Echocardiography is a painless test that uses sound waves to create images of your heart. It provides your doctor with information about the size and shape of your heart  and how well your heart's chambers and valves are working. This procedure takes approximately one hour. There are no restrictions for this procedure.  ZIO XT- Long Term Monitor Instructions   Your physician has requested you wear your ZIO patch monitor_______days.   This is a single patch monitor.  [employer] supplies one patch monitor per enrollment.  Additional stickers are not available.   Please do not apply patch if you will be having a Nuclear Stress Test, Echocardiogram, Cardiac CT, MRI, or Chest Xray during the time frame you would be wearing the monitor. The patch cannot be worn during these tests.  You cannot remove and re-apply the ZIO XT patch monitor.   Your ZIO patch monitor will be sent USPS Priority mail from [facility] directly to your home address. The monitor may also be mailed to a PO BOX if home delivery is not available.   It may take 3-5 days to receive your monitor after you have been enrolled.   Once you have received you monitor, please review enclosed instructions.  Your monitor has already been registered assigning a specific monitor serial # to you.   Applying the monitor   Shave hair from upper left chest.   Hold abrader disc by orange tab.  Rub abrader in 40 strokes over left upper chest as indicated in your monitor instructions.   Clean area with 4 enclosed alcohol pads .  Use all pads to assure are is cleaned thoroughly.  Let dry.   Apply patch as indicated in monitor instructions.  Patch will be place under collarbone on left side of chest with arrow pointing upward.   Rub patch adhesive wings for 2 minutes.Remove white label marked "1".  Remove white label marked "2".  Rub patch adhesive wings for 2 additional minutes.   While looking in a mirror, press and release button in center of patch.  A small green light will flash 3-4 times .  This will be your only indicator the monitor has been turned on.     Do not shower for the first 24 hours.  You  may shower after the first 24 hours.   Press button if you feel a symptom. You will hear a small click.  Record Date, Time and Symptom in the Patient Log Book.   When you are ready to remove patch, follow instructions on last 2 pages of Patient Log Book.  Stick patch monitor onto last page of Patient Log Book.   Place Patient Log  Book in [facility] box.  Use locking tab on box and tape box closed securely.  The [employer] and [employer] has [employer] on it.  Please place in mailbox as soon as possible.  Your physician should have your test results approximately 7 days after the monitor has been mailed back to [facility].   Call [facility] Customer Care at [phone] if you have questions regarding your ZIO XT patch monitor.  Call them immediately if you see an orange light blinking on your monitor.   If your monitor falls off in less than 4 days contact our Monitor department at [phone].  If your monitor becomes loose or falls off after 4 days call [employer] at [phone] for suggestions on securing your monitor.     Follow-Up: At [facility], you and your health needs are our priority.  As part of our continuing mission to provide you with exceptional heart care, we have created designated Provider Care Teams.  These Care Teams include your primary Cardiologist (physician) and Advanced Practice Providers (APPs -  Physician Assistants and Nurse Practitioners) who all work together to provide you with the care you need, when you need it.  We recommend signing up for the patient portal called "MyChart".  Sign up information is provided on this After Visit Summary.  MyChart is used to connect with patients for Virtual Visits (Telemedicine).  Patients are able to view lab/test results, encounter notes, upcoming appointments, etc.  Non-urgent messages can be sent to your provider as well.   To learn more about what you can do with MyChart, go to [email or web address].    Your next  appointment:   3 month(s)  The format for your next appointment:   In Person  Provider:   You may see Dr. Laurance Flatten or one of the following Advanced Practice Providers on your designated Care Team:    Tereso Newcomer, PA-C  [place]helsea Aus, [place]        Signed, Meriam Sprague, MD  11/20/2020 10:47 AM    [facility]

## 2020-11-20 ENCOUNTER — Ambulatory Visit (INDEPENDENT_AMBULATORY_CARE_PROVIDER_SITE_OTHER): Payer: BC Managed Care – PPO

## 2020-11-20 ENCOUNTER — Encounter: Payer: Self-pay | Admitting: Cardiology

## 2020-11-20 ENCOUNTER — Other Ambulatory Visit: Payer: Self-pay

## 2020-11-20 ENCOUNTER — Ambulatory Visit (INDEPENDENT_AMBULATORY_CARE_PROVIDER_SITE_OTHER): Payer: BC Managed Care – PPO | Admitting: Cardiology

## 2020-11-20 ENCOUNTER — Encounter: Payer: Self-pay | Admitting: *Deleted

## 2020-11-20 VITALS — BP 120/90 | HR 81 | Ht 72.0 in | Wt 226.0 lb

## 2020-11-20 DIAGNOSIS — R002 Palpitations: Secondary | ICD-10-CM

## 2020-11-20 DIAGNOSIS — I429 Cardiomyopathy, unspecified: Secondary | ICD-10-CM | POA: Diagnosis not present

## 2020-11-20 DIAGNOSIS — N529 Male erectile dysfunction, unspecified: Secondary | ICD-10-CM | POA: Diagnosis not present

## 2020-11-20 NOTE — Patient Instructions (Signed)
Medication Instructions:  Your physician recommends that you continue on your current medications as directed. Please refer to the Current Medication list given to you today. *If you need a refill on your cardiac medications before your next appointment, please call your pharmacy*   Lab Work: None  If you have labs (blood work) drawn today and your tests are completely normal, you will receive your results only by: Marland Kitchen MyChart Message (if you have MyChart) OR . A paper copy in the mail If you have any lab test that is abnormal or we need to change your treatment, we will call you to review the results.   Testing/Procedures: Your physician has requested that you have an echocardiogram. Echocardiography is a painless test that uses sound waves to create images of your heart. It provides your doctor with information about the size and shape of your heart and how well your heart's chambers and valves are working. This procedure takes approximately one hour. There are no restrictions for this procedure.  ZIO XT- Long Term Monitor Instructions   Your physician has requested you wear your ZIO patch monitor_______days.   This is a single patch monitor.  [employer] supplies one patch monitor per enrollment.  Additional stickers are not available.   Please do not apply patch if you will be having a Nuclear Stress Test, Echocardiogram, Cardiac CT, MRI, or Chest Xray during the time frame you would be wearing the monitor. The patch cannot be worn during these tests.  You cannot remove and re-apply the ZIO XT patch monitor.   Your ZIO patch monitor will be sent USPS Priority mail from [facility] directly to your home address. The monitor may also be mailed to a PO BOX if home delivery is not available.   It may take 3-5 days to receive your monitor after you have been enrolled.   Once you have received you monitor, please review enclosed instructions.  Your monitor has already been registered  assigning a specific monitor serial # to you.   Applying the monitor   Shave hair from upper left chest.   Hold abrader disc by orange tab.  Rub abrader in 40 strokes over left upper chest as indicated in your monitor instructions.   Clean area with 4 enclosed alcohol pads .  Use all pads to assure are is cleaned thoroughly.  Let dry.   Apply patch as indicated in monitor instructions.  Patch will be place under collarbone on left side of chest with arrow pointing upward.   Rub patch adhesive wings for 2 minutes.Remove white label marked "1".  Remove white label marked "2".  Rub patch adhesive wings for 2 additional minutes.   While looking in a mirror, press and release button in center of patch.  A small green light will flash 3-4 times .  This will be your only indicator the monitor has been turned on.     Do not shower for the first 24 hours.  You may shower after the first 24 hours.   Press button if you feel a symptom. You will hear a small click.  Record Date, Time and Symptom in the Patient Log Book.   When you are ready to remove patch, follow instructions on last 2 pages of Patient Log Book.  Stick patch monitor onto last page of Patient Log Book.   Place Patient Log Book in [place] box.  Use locking tab on box and tape box closed securely.  The [place] and [employer]  has prepaid postage on it.  Please place in mailbox as soon as possible.  Your physician should have your test results approximately 7 days after the monitor has been mailed back to [facility].   Call [facility] Customer Care at [phone] if you have questions regarding your ZIO XT patch monitor.  Call them immediately if you see an orange light blinking on your monitor.   If your monitor falls off in less than 4 days contact our Monitor department at [phone].  If your monitor becomes loose or falls off after 4 days call [employer] at [phone] for suggestions on securing your monitor.      Follow-Up: At [facility], you and your health needs are our priority.  As part of our continuing mission to provide you with exceptional heart care, we have created designated Provider Care Teams.  These Care Teams include your primary Cardiologist (physician) and Advanced Practice Providers (APPs -  Physician Assistants and Nurse Practitioners) who all work together to provide you with the care you need, when you need it.  We recommend signing up for the patient portal called "MyChart".  Sign up information is provided on this After Visit Summary.  MyChart is used to connect with patients for Virtual Visits (Telemedicine).  Patients are able to view lab/test results, encounter notes, upcoming appointments, etc.  Non-urgent messages can be sent to your provider as well.   To learn more about what you can do with MyChart, go to [email or web address].    Your next appointment:   3 month(s)  The format for your next appointment:   In Person  Provider:   You may see Dr. Laurance Flatten or one of the following Advanced Practice Providers on your designated Care Team:    Tereso Newcomer, PA-C  [place], [place]

## 2020-11-20 NOTE — Progress Notes (Signed)
Patient ID: Mark Graves, male   DOB: 01-07-1971, 50 y.o.   MRN: [id number] Patient enrolled for [employer] to ship a 7 day ZIO XT long term holter monitor to his home.

## 2020-11-25 DIAGNOSIS — R002 Palpitations: Secondary | ICD-10-CM | POA: Diagnosis not present

## 2020-12-13 ENCOUNTER — Other Ambulatory Visit: Payer: Self-pay

## 2020-12-13 ENCOUNTER — Ambulatory Visit (HOSPITAL_COMMUNITY): Payer: BC Managed Care – PPO | Attending: Cardiovascular Disease

## 2020-12-13 DIAGNOSIS — I429 Cardiomyopathy, unspecified: Secondary | ICD-10-CM

## 2020-12-13 LAB — ECHOCARDIOGRAM COMPLETE
Area-P 1/2: 4.6 cm2
S' Lateral: 3.2 cm

## 2020-12-17 DIAGNOSIS — R002 Palpitations: Secondary | ICD-10-CM | POA: Diagnosis not present

## 2021-01-14 DIAGNOSIS — B009 Herpesviral infection, unspecified: Secondary | ICD-10-CM | POA: Diagnosis not present

## 2021-02-12 NOTE — Progress Notes (Deleted)
Cardiology Office Note:    Date:  02/12/2021   ID:  Mark Graves, DOB 04-Feb-1971, MRN [id number]  PCP:  Patient, No Pcp Per (Inactive)   [facility]  Cardiologist:  None  Advanced Practice Provider:  No care team member to display Electrophysiologist:  None    Referring MD: No ref. provider found    History of Present Illness:    Mark Graves is a 50 y.o. male with a hx of dilated cardiomyopathy who presents for follow-up of his dilated cardiomyopathy.  The patient has history of dilated CM that was initially diagnosed in 2004. Specifically, he began weight lifting in [place] in 2004 and started developing GERD like symptoms, syncope, and shortness of breath. Had about a week of symptoms and then finally went to the ER for evaluation. Received a catheterization and there was no blockages at that time, but he was told his arteries were "swollen" and the heart was enlarged. Was prescribed medication, but he did not take them and he changed his diet. Saw Cardiologist about 1 month later and he was told his condition improved and no changes were made. He was doing well until 2008 where he had recurrence of GERD like symptoms but it went away and he did not take medications at that time. In 2017, he presented in [place] with chest discomfort. Was evaluated in the ER and discharged home. Developed recurrent symptoms in 09/2020 with the sensation that his heart was skipping beats. Saw PCP and was referred here for further management.   During last visit on 11/20/20, the patient was having frequent palpitations. No HF symptoms. Cardiac monitor with no significant arrhythmias and rare ectopy. TTE 12/13/20 with LVEF 55-60%, normal strain -22.9%, normal diastolic function, no significant valve disease   Past Medical History:  Diagnosis Date  . Dilated cardiomyopathy [facility])     Past Surgical History:  Procedure Laterality Date  . APPENDECTOMY    . dilated cardiomyapathy     . VASECTOMY      Current Medications: No outpatient medications have been marked as taking for the 02/19/21 encounter (Appointment) with Meriam Sprague, MD.     Allergies:   Patient has no known allergies.   Social History   Socioeconomic History  . Marital status: Married    Spouse name: Not on file  . Number of children: 4  . Years of education: 36  . Highest education level: Not on file  Occupational History  . Not on file  Tobacco Use  . Smoking status: Never Smoker  . Smokeless tobacco: Never Used  Vaping Use  . Vaping Use: Never used  Substance and Sexual Activity  . Alcohol use: No    Alcohol/week: 0.0 standard drinks  . Drug use: No  . Sexual activity: Not on file  Other Topics Concern  . Not on file  Social History Narrative   Fun: Workout, acting, sports   Denies religious beliefs effecting health care.   Social Determinants of Health   Financial Resource Strain: Not on file  Food Insecurity: Not on file  Transportation Needs: Not on file  Physical Activity: Not on file  Stress: Not on file  Social Connections: Not on file     Family History: The patient's family history includes Cancer in his maternal grandmother; Diabetes in his mother; Heart disease in his maternal grandfather, maternal grandmother, paternal grandfather, and paternal grandmother.  ROS:   Please see the history of present illness.    Review  of Systems  Constitutional: Negative for chills, fever and malaise/fatigue.  HENT: Negative for hearing loss.   Eyes: Negative for blurred vision and redness.  Respiratory: Negative for shortness of breath.   Cardiovascular: Positive for palpitations. Negative for chest pain, orthopnea, claudication, leg swelling and PND.  Gastrointestinal: Negative for melena, nausea and vomiting.  Genitourinary: Negative for dysuria.  Musculoskeletal: Positive for myalgias.  Neurological: Negative for dizziness and loss of consciousness.   Endo/Heme/Allergies: Negative for polydipsia.  Psychiatric/Behavioral: Negative for substance abuse.    EKGs/Labs/Other Studies Reviewed:    The following studies were reviewed today: Cardiac monitor 12/18/20:  Patch wear time was 7 days and 1 hour  Predominant rhythm was NSR with average HR 89bpm; ranging from 52-176bpm  Rare SVE, rare PVCs  Patient triggered events correlated with NSR  No significant arrhythmias  Overall, normal cardiac monitor  TTE 12/13/20: IMPRESSIONS    1. Left ventricular ejection fraction, by estimation, is 55 to 60%. Left  ventricular ejection fraction by 3D volume is 56 %. The left ventricle has  normal function. The left ventricle has no regional wall motion  abnormalities. Left ventricular diastolic  parameters were normal. The average left ventricular global longitudinal  strain is -22.9 %. The global longitudinal strain is normal.  2. Right ventricular systolic function is normal. The right ventricular  size is normal. Tricuspid regurgitation signal is inadequate for assessing  PA pressure.  3. The mitral valve is grossly normal. Trivial mitral valve  regurgitation. No evidence of mitral stenosis.  4. The aortic valve is tricuspid. Aortic valve regurgitation is not  visualized. No aortic stenosis is present.  5. The inferior vena cava is normal in size with greater than 50%  respiratory variability, suggesting right atrial pressure of 3 mmHg.   EKG:  EKG is  ordered today.  The ekg ordered today demonstrates NSR with diffuse TWI in precordial leads (? Early repol); HR 81  Recent Labs: No results found for requested labs within last 8760 hours.  Recent Lipid Panel No results found for: CHOL, TRIG, HDL, CHOLHDL, VLDL, LDLCALC, LDLDIRECT    Physical Exam:    VS:  There were no vitals taken for this visit.    Wt Readings from Last 3 Encounters:  11/20/20 226 lb (102.5 kg)  05/12/17 219 lb 3.2 oz (99.4 kg)  07/23/15 223 lb (101.2  kg)     GEN:  Well nourished, well developed in no acute distress HEENT: Normal NECK: No JVD; No carotid bruits CARDIAC: RRR, no murmurs, rubs, gallops RESPIRATORY:  Clear to auscultation without rales, wheezing or rhonchi  ABDOMEN: Soft, non-tender, non-distended MUSCULOSKELETAL:  No edema; No deformity  SKIN: Warm and dry NEUROLOGIC:  Alert and oriented x 3 PSYCHIATRIC:  Normal affect   ASSESSMENT:    No diagnosis found. PLAN:    In order of problems listed above:  #Dilated Cardiomyopathy: Patient with history of dilated cardiomyopathy that was diagnosed in 2004 in [place]. Received a cath at that time (records not available) which showed no obstructive disease but "swollen arteries" and an "enlarged heart with reduced pumping function." Was recommended fro medication but he did not take it. Followed up with Cardiology in [facility] and was told he had improved. Repeat TTE 11/2020 with LVEF 55-60%, normal strain, normal diastolic function, no valve disease.  -?BB or ARB  #Palpitations: Cardiac monitor with no significant arrhythmias or ectopy. -?BB   Medication Adjustments/Labs and Tests Ordered: Current medicines are reviewed at length with the patient today.  Concerns regarding medicines are outlined above.  No orders of the defined types were placed in this encounter.  No orders of the defined types were placed in this encounter.   There are no Patient Instructions on file for this visit.   Signed, Meriam Sprague, MD  02/12/2021 11:39 AM    [facility]

## 2021-02-19 ENCOUNTER — Ambulatory Visit: Payer: BC Managed Care – PPO | Admitting: Cardiology

## 2021-02-19 NOTE — Progress Notes (Incomplete)
Cardiology Office Note:    Date:  02/19/2021   ID:  Mark Graves, DOB 1971/08/14, MRN [id number]  PCP:  Patient, No Pcp Per (Inactive)   [facility]  Cardiologist:  None  Advanced Practice Provider:  No care team member to display Electrophysiologist:  None    Referring MD: No ref. provider found    History of Present Illness:    Mark Graves is a 50 y.o. male with a hx of dilated cardiomyopathy who presents for follow-up of his dilated cardiomyopathy.  The patient has history of dilated CM that was initially diagnosed in 2004. Specifically, he began weight lifting in [place] in 2004 and started developing GERD like symptoms, syncope, and shortness of breath. Had about a week of symptoms and then finally went to the ER for evaluation. Received a catheterization and there was no blockages at that time, but he was told his arteries were "swollen" and the heart was enlarged. Was prescribed medication, but he did not take them and he changed his diet. Saw Cardiologist about 1 month later and he was told his condition improved and no changes were made. He was doing well until 2008 where he had recurrence of GERD like symptoms but it went away and he did not take medications at that time. In 2017, he presented in [place] with chest discomfort. Was evaluated in the ER and discharged home. Developed recurrent symptoms in 09/2020 with the sensation that his heart was skipping beats. Saw PCP and was referred here for further management.   During last visit on 11/20/20, the patient was having frequent palpitations. No HF symptoms. Cardiac monitor with no significant arrhythmias and rare ectopy. TTE 12/13/20 with LVEF 55-60%, normal strain -22.9%, normal diastolic function, no significant valve disease.  Today, ***  He exertional shortness of breath, chest pain, tightness, or pressure. He PND, orthopnea, lightheadedness, or syncopal episodes.     Past Medical History:   Diagnosis Date  . Dilated cardiomyopathy [facility])     Past Surgical History:  Procedure Laterality Date  . APPENDECTOMY    . dilated cardiomyapathy    . VASECTOMY      Current Medications: No outpatient medications have been marked as taking for the 02/19/21 encounter (Appointment) with Meriam Sprague, MD.     Allergies:   Patient has no known allergies.   Social History   Socioeconomic History  . Marital status: Married    Spouse name: Not on file  . Number of children: 4  . Years of education: 12  . Highest education level: Not on file  Occupational History  . Not on file  Tobacco Use  . Smoking status: Never Smoker  . Smokeless tobacco: Never Used  Vaping Use  . Vaping Use: Never used  Substance and Sexual Activity  . Alcohol use: No    Alcohol/week: 0.0 standard drinks  . Drug use: No  . Sexual activity: Not on file  Other Topics Concern  . Not on file  Social History Narrative   Fun: Workout, acting, sports   Denies religious beliefs effecting health care.   Social Determinants of Health   Financial Resource Strain: Not on file  Food Insecurity: Not on file  Transportation Needs: Not on file  Physical Activity: Not on file  Stress: Not on file  Social Connections: Not on file     Family History: The patient's family history includes Cancer in his maternal grandmother; Diabetes in his mother; Heart disease in his maternal  grandfather, maternal grandmother, paternal grandfather, and paternal grandmother.  ROS:   Please see the history of present illness.    Review of Systems  Constitutional: Negative for chills, fever and malaise/fatigue.  HENT: Negative for hearing loss.   Eyes: Negative for blurred vision and redness.  Respiratory: Negative for shortness of breath.   Cardiovascular: Negative for chest pain, palpitations, orthopnea, claudication, leg swelling and PND.  Gastrointestinal: Negative for melena, nausea and vomiting.  Genitourinary:  Negative for dysuria.  Musculoskeletal: Negative for myalgias.  Neurological: Negative for dizziness and loss of consciousness.  Endo/Heme/Allergies: Negative for polydipsia.  Psychiatric/Behavioral: Negative for substance abuse.    EKGs/Labs/Other Studies Reviewed:    The following studies were reviewed today: Cardiac monitor 12/18/20:  Patch wear time was 7 days and 1 hour  Predominant rhythm was NSR with average HR 89bpm; ranging from 52-176bpm  Rare SVE, rare PVCs  Patient triggered events correlated with NSR  No significant arrhythmias  Overall, normal cardiac monitor  TTE 12/13/20: IMPRESSIONS    1. Left ventricular ejection fraction, by estimation, is 55 to 60%. Left  ventricular ejection fraction by 3D volume is 56 %. The left ventricle has  normal function. The left ventricle has no regional wall motion  abnormalities. Left ventricular diastolic  parameters were normal. The average left ventricular global longitudinal  strain is -22.9 %. The global longitudinal strain is normal.  2. Right ventricular systolic function is normal. The right ventricular  size is normal. Tricuspid regurgitation signal is inadequate for assessing  PA pressure.  3. The mitral valve is grossly normal. Trivial mitral valve  regurgitation. No evidence of mitral stenosis.  4. The aortic valve is tricuspid. Aortic valve regurgitation is not  visualized. No aortic stenosis is present.  5. The inferior vena cava is normal in size with greater than 50%  respiratory variability, suggesting right atrial pressure of 3 mmHg.   EKG:  EKG is  ordered today.  The ekg ordered today demonstrates NSR with diffuse TWI in precordial leads (? Early repol); HR 81  Recent Labs: No results found for requested labs within last 8760 hours.  Recent Lipid Panel No results found for: CHOL, TRIG, HDL, CHOLHDL, VLDL, LDLCALC, LDLDIRECT    Physical Exam:    VS:  There were no vitals taken for this visit.     Wt Readings from Last 3 Encounters:  11/20/20 226 lb (102.5 kg)  05/12/17 219 lb 3.2 oz (99.4 kg)  07/23/15 223 lb (101.2 kg)     GEN:  Well nourished, well developed in no acute distress HEENT: Normal NECK: No JVD; No carotid bruits CARDIAC: RRR, no murmurs, rubs, gallops RESPIRATORY:  Clear to auscultation without rales, wheezing or rhonchi  ABDOMEN: Soft, non-tender, non-distended MUSCULOSKELETAL:  No edema; No deformity  SKIN: Warm and dry NEUROLOGIC:  Alert and oriented x 3 PSYCHIATRIC:  Normal affect   ASSESSMENT:    No diagnosis found. PLAN:    In order of problems listed above:  #Dilated Cardiomyopathy: Patient with history of dilated cardiomyopathy that was diagnosed in 2004 in [place]. Received a cath at that time (records not available) which showed no obstructive disease but "swollen arteries" and an "enlarged heart with reduced pumping function." Was recommended fro medication but he did not take it. Followed up with Cardiology in [facility] and was told he had improved. Repeat TTE 11/2020 with LVEF 55-60%, normal strain, normal diastolic function, no valve disease.  -?BB or ARB  #Palpitations: Cardiac monitor with no significant  arrhythmias or ectopy. -?BB   Medication Adjustments/Labs and Tests Ordered: Current medicines are reviewed at length with the patient today.  Concerns regarding medicines are outlined above.  No orders of the defined types were placed in this encounter.  No orders of the defined types were placed in this encounter.   There are no Patient Instructions on file for this visit.    I,Alexis Bryant,acting as a Neurosurgeon for Meriam Sprague, MD.,have documented all relevant documentation on the behalf of Meriam Sprague, MD,as directed by  Meriam Sprague, MD while in the presence of Meriam Sprague, MD.  ***  Signed, Estrella Deeds  02/19/2021 8:00 AM    [facility]

## 2021-02-26 DIAGNOSIS — U071 COVID-19: Secondary | ICD-10-CM | POA: Diagnosis not present

## 2021-02-26 DIAGNOSIS — R490 Dysphonia: Secondary | ICD-10-CM | POA: Diagnosis not present

## 2021-03-04 DIAGNOSIS — J019 Acute sinusitis, unspecified: Secondary | ICD-10-CM | POA: Diagnosis not present

## 2021-03-04 DIAGNOSIS — U071 COVID-19: Secondary | ICD-10-CM | POA: Diagnosis not present

## 2023-07-21 DIAGNOSIS — N529 Male erectile dysfunction, unspecified: Secondary | ICD-10-CM | POA: Diagnosis not present

## 2023-07-21 DIAGNOSIS — N138 Other obstructive and reflux uropathy: Secondary | ICD-10-CM | POA: Diagnosis not present

## 2023-07-21 DIAGNOSIS — E291 Testicular hypofunction: Secondary | ICD-10-CM | POA: Diagnosis not present

## 2023-07-21 DIAGNOSIS — R3589 Other polyuria: Secondary | ICD-10-CM | POA: Diagnosis not present

## 2023-07-21 DIAGNOSIS — N401 Enlarged prostate with lower urinary tract symptoms: Secondary | ICD-10-CM | POA: Diagnosis not present

## 2023-07-21 DIAGNOSIS — T387X5S Adverse effect of androgens and anabolic congeners, sequela: Secondary | ICD-10-CM | POA: Diagnosis not present

## 2024-01-18 DIAGNOSIS — H1031 Unspecified acute conjunctivitis, right eye: Secondary | ICD-10-CM | POA: Diagnosis not present
